# Patient Record
Sex: Male | Born: 1995 | Race: White | Hispanic: No | Marital: Single | Smoking: Current every day smoker
Health system: Southern US, Community
[De-identification: ages and names within clinical notes are randomized; demographics above are authoritative.]

---
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
## 2013-10-15 ENCOUNTER — Emergency Department: Payer: Self-pay | Admitting: Emergency Medicine

## 2015-02-20 NOTE — H&P (Signed)
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  IDENTIFYING INFORMATION: Willie Hawkins is an 19 year old single Caucasian male currently living with his mother in the [place] area. 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 The patient says that he has used acid on a regular basis since the age of 25 and has used twice in the past one week. He also reports using marijuana once a day for the past three years. He says he came to the hospital because he is having "a bad super fucking trip". He says his mother and grandmother brought him because he has been hallucinating at home. The patient says that he is "super frustrated" and that everything is getting on his nerves. He says he cannot tell whether or not a male is a male and a male is a male without looking at the length of their hair. He is also seeing people five steps in front of where they are and seeing people as double. Per his mother, the patient has been somewhat agitated over the past one week and has been throwing objects in the home and punching walls. The patient denies any mood symptoms such as depressive symptoms but does state that he believes he will try and hurt himself if he goes on this way and is asking for help. He had endorsed some homicidal thoughts towards his mother but says that he does not really mean that and he thinks it is the drugs talking. He does admit to some visual hallucinations at night of seeing things on his arm, although he cannot verbalize exactly what it is. The patient  was very vague with his descriptions of hallucinations. He did admit to some paranoid thoughts in believing that the police are after him. He also says that people are talking about things with "sexual references" but he could not describe specifically what they are. He does report problems with insomnia and decreased appetite. He says he's lost about 20 to 30 pounds in the past three months. Although the patient did not admit this to this Clinical research associate, his mother said that he had mentioned something about using mushrooms. The patient denies any history of any prior suicide attempts or inpatient psychiatric hospitalizations. Toxicology screen in the Emergency Room was positive for marijuana but negative for all other substances. Ethanol level was less than 3. Per his mother, he has been abusing substances for 3 to 4 years now but has been resistant to any residential substance abuse treatment. His mother said that they had to "trick him" to get him to the Emergency Room today. While in the Emergency Room, however, he says he voluntarily wants help and is willing to be admitted to the hospital.   PAST PSYCHIATRIC HISTORY: The patient denied any history of any prior suicide attempts but his mother said that he tried to cut himself once in the context of substance use. He denies ever being on any psychotropic medications in the past and is not currently seeing a psychiatrist.   SUBSTANCE ABUSE HISTORY: As stated in the history of present  illness. He only drinks alcohol once a week. There is a history of cannabis dependence for the past three years and hallucinogen abuse. He denies any heroin or opioid abuse. He denies any cocaine use. He does smoke a half a pack of cigarettes to a pack of cigarettes per day since the age of 65.   FAMILY PSYCHIATRIC HISTORY: The patient reports that his father was an alcoholic.   PAST MEDICAL HISTORY: Asthma.   PCP: Dr. Collier Salina    He denies any history of any prior TBI or  seizures. He denies any prior surgical history.  MEDICATIONS: The patient says that he believes he takes an inhaler at times but cannot remember the name. His mother was also unaware of the name of the inhaler that he takes.   ALLERGIES: Bee stings.   SOCIAL HISTORY: The patient says he was born in [place] but raised in the [place] area by his mother. He says his parents divorced when he was young and his father was in prison when he was born. He does have two sisters. He denies any history of any physical or sexual abuse. 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 He also wants to be able to go for music. He denies being in a relationship at this time and has never been married. He has no children. He currently lives with his mom in the [place] area.   LEGAL HISTORY: He does have a history of a DUI for alcohol and marijuana use at the age of 22. He says the charges were dropped and there was no jail time.   MENTAL STATUS EXAM: Willie Hawkins is an 19 year old Caucasian male with shoulder length brown hair. He was fully alert and oriented to time, place, and situation. He knew it was September 2013 but had a difficult time coming up with the day of the month. He did know it was Wednesday. The patient did have a slowed response and delayed response time. He named the prior presidents as Midwife and Danae Orleans but then had difficulty. Speech was regular rate and rhythm, fluent and coherent. Mood was described as being "not good" and affect was anxious. The patient was easily agitated and got frustrated with questions asked. Thought processes were tangential and sometimes disorganized. He denied any suicidal or homicidal thoughts currently but did state that he had felt suicidal and that he may do something to hurt himself if he did not get help. He did report some visual hallucinations currently of seeing people walking five feet in front of himself and some  double vision. He also endorsed some paranoid thoughts that the police were after him and was somewhat delusional. He denied any auditory hallucinations. Attention and concentration were fair to good. Recall was 3 out of 3 initially and 3 out of 3 after five minutes. Judgment and insight were fair by testing but poor by history. He spelled world backwards as "DORWL". He could not do any serial sevens but could do some simple calculations. The patient did not answer questions directly with regards to proverbs.   SUICIDE RISK ASSESSMENT: At this time the patient remains at a moderately elevated risk of harm to self and others secondary to active psychosis. He is saying that he wants to come to the hospital voluntarily. He denies any access to guns.   REVIEW OF SYSTEMS: CONSTITUTIONAL: The patient denies any weakness or fatigue. He does complain of weight loss  in the past three months of 20 to 30 pounds. He denies any fever, chills, or night sweats. HEAD: He denies any headaches or dizziness. EYES: He denies any diplopia or blurred vision. ENT: He denies any hearing loss, neck pain, or throat pain. RESPIRATORY: He denies any shortness breath or cough. CARDIOVASCULAR: He denies any chest pain or orthopnea. GI: He denies any nausea, vomiting, or abdominal pain. He denies any change in bowel movements. GU: He denies incontinence or problems with frequency of urine. ENDOCRINE: He denies any heat or cold intolerance. LYMPHATIC: He denies any anemia or easy bruising. MUSCULOSKELETAL: He denies any muscle or joint pain. NEUROLOGIC: He denies any tingling or weakness. PSYCHIATRIC: Please see history of present illness.   PHYSICAL EXAMINATION:   VITAL SIGNS: Blood pressure 143/90, pulse 95, respirations 20, temperature 98, pulse oximetry 100% on room air.   HEENT: Normocephalic, atraumatic. Pupils equal, round, and reactive to light and accommodation. Extraocular movements intact. Oral mucosa was moist. No lesions  noted.   NECK: Supple. No cervical lymphadenopathy or thyromegaly present.   LUNGS: Clear to auscultation bilaterally. No crackles, rales, or rhonchi.   CARDIAC: S1, S2, present. Regular rate and rhythm. No murmurs, rubs, or gallops.   ABDOMEN: Soft. Normoactive bowel sounds present in all four quadrants. No tenderness noted. No masses noted.   EXTREMITIES: +2 pedal pulses bilaterally. No rashes, cyanosis, clubbing, or edema.   NEUROLOGIC: Cranial nerves II through XII were grossly intact. Gait was normal and steady. Sensation intact. No hypo or hyperreflexia noted.   LABORATORY, DIAGNOSTIC, AND RADIOLOGICAL DATA: Toxicology screen was positive for marijuana but negative for all other substances. Ethanol level was less than 3. Sodium 140, potassium 3.5, chloride 104, CO2 28, BUN 6, creatinine 1.01, glucose 106, alkaline phosphatase 80, AST 25, ALT 27. TSH within normal limits. CBC within normal limits. Urinalysis was nitrite negative, trace leukocyte esterase, 1 WBC, trace bacteria, less than 1 epithelial cell.   DIAGNOSES:  AXIS I:  1. Psychosis, not otherwise specified, rule out substance-induced psychotic disorder, rule out bipolar disorder, most recent episode manic.  2. Cannabis dependence.  3. Hallucinogen abuse.  4. Nicotine dependence.   AXIS II: Deferred.   AXIS III: Asthma.   AXIS IV: Moderate to severe. Comorbid substance use, lack of compliance with substance abuse treatment in the past.   AXIS V: GAF at present equals 25.   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 He also endorsed some suicidal thoughts if he did not get help with treatment and with the hallucinations as he feels like he is on a "bad trip". Will go ahead  and admit to Inpatient Psychiatry for medication management, safety, and stabilization and place on suicide precautions and close observation.   1. Psychosis, not otherwise specified, rule out substance-induced psychotic disorder, rule out bipolar disorder, most recent episode manic. Will plan to start Risperdal 2 mg p.o. at bedtime and trazodone 50 mg p.o. at bedtime p.r.n. Will get EKG to rule out QTc prolongation and lipid panel in the a.m. Will also check B12 and folic acid.  2. Polysubstance abuse including cannabis dependence and hallucinogen abuse. Will use Ativan and Haldol p.r.n. for now and wait for drugs to clear from his system. The patient denies any cocaine, heroin, or opioid abuse. He denies  any alcohol use other than about once a week. Will refer for residential substance abuse treatment.  3. Asthma. Will go ahead and give p.r.n. albuterol inhaler if needed. The patient does not have any respiratory distress.  4. Disposition. The patient has a stable living situation. Will refer for residential substance abuse treatment, however. At this time the patient is wanting to stay in the hospital voluntarily.   TIME SPENT: 80 minutes ____________________________ Doralee AlbinoAarti K. Maryruth BunKapur, MD akk:drc D: 07/28/2012 17:49:45 ET T: 07/28/2012 18:09:22 ET JOB#: [id number]  cc: Aarti K. Maryruth BunKapur, MD, <Dictator> Darliss RidgelAARTI K KAPUR MD ELECTRONICALLY SIGNED 07/29/2012 16:20

## 2017-08-16 ENCOUNTER — Emergency Department: Payer: Self-pay

## 2017-08-16 ENCOUNTER — Emergency Department
Admission: EM | Admit: 2017-08-16 | Discharge: 2017-08-16 | Disposition: A | Discharge status: Home / Self Care | Payer: Self-pay | Attending: Emergency Medicine | Admitting: Emergency Medicine

## 2017-08-16 ENCOUNTER — Encounter: Payer: Self-pay | Admitting: Emergency Medicine

## 2017-08-16 DIAGNOSIS — Y9389 Activity, other specified: Secondary | ICD-10-CM | POA: Insufficient documentation

## 2017-08-16 DIAGNOSIS — S39012A Strain of muscle, fascia and tendon of lower back, initial encounter: Secondary | ICD-10-CM | POA: Insufficient documentation

## 2017-08-16 DIAGNOSIS — Y990 Civilian activity done for income or pay: Secondary | ICD-10-CM | POA: Insufficient documentation

## 2017-08-16 DIAGNOSIS — Y92410 Unspecified street and highway as the place of occurrence of the external cause: Secondary | ICD-10-CM | POA: Insufficient documentation

## 2017-08-16 DIAGNOSIS — M542 Cervicalgia: Secondary | ICD-10-CM | POA: Insufficient documentation

## 2017-08-16 DIAGNOSIS — F17210 Nicotine dependence, cigarettes, uncomplicated: Secondary | ICD-10-CM | POA: Insufficient documentation

## 2017-08-16 MED ORDER — METHOCARBAMOL 500 MG PO TABS: 500.0000 mg | ORAL_TABLET | Freq: Four times a day (QID) | ORAL | 0 refills | Status: DC

## 2017-08-16 MED ORDER — MELOXICAM 15 MG PO TABS: 15.0000 mg | ORAL_TABLET | Freq: Every day | ORAL | 2 refills | Status: AC
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
## 2017-08-16 NOTE — Discharge Instructions (Signed)
Follow-up with the acute care clinic if you are not better in 7-10 days. Use ice on all areas that hurt. Use medications as prescribed. If you're worsening may return to the emergency department or follow-up at the acute care.
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
## 2021-03-19 ENCOUNTER — Emergency Department
Admission: EM | Admit: 2021-03-19 | Discharge: 2021-03-19 | Disposition: A | Discharge status: Home / Self Care | Payer: Medicaid Other | Attending: Emergency Medicine | Admitting: Emergency Medicine

## 2021-03-19 ENCOUNTER — Other Ambulatory Visit: Payer: Self-pay

## 2021-03-19 ENCOUNTER — Encounter: Payer: Self-pay | Admitting: Medical Oncology

## 2021-03-19 DIAGNOSIS — T40601A Poisoning by unspecified narcotics, accidental (unintentional), initial encounter: Secondary | ICD-10-CM

## 2021-03-19 DIAGNOSIS — T402X1A Poisoning by other opioids, accidental (unintentional), initial encounter: Secondary | ICD-10-CM | POA: Insufficient documentation

## 2021-03-19 DIAGNOSIS — F17200 Nicotine dependence, unspecified, uncomplicated: Secondary | ICD-10-CM | POA: Insufficient documentation

## 2021-03-19 LAB — CBC
HCT: 41.2 % (ref 39.0–52.0)
Hemoglobin: 14.3 g/dL (ref 13.0–17.0)
MCH: 30.4 pg (ref 26.0–34.0)
MCHC: 34.7 g/dL (ref 30.0–36.0)
MCV: 87.7 fL (ref 80.0–100.0)
Platelets: 221 10*3/uL (ref 150–400)
RBC: 4.7 MIL/uL (ref 4.22–5.81)
RDW: 12.3 % (ref 11.5–15.5)
WBC: 8.2 10*3/uL (ref 4.0–10.5)
nRBC: 0 % (ref 0.0–0.2)

## 2021-03-19 LAB — COMPREHENSIVE METABOLIC PANEL
ALT: 15 U/L (ref 0–44)
AST: 25 U/L (ref 15–41)
Albumin: 4.8 g/dL (ref 3.5–5.0)
Alkaline Phosphatase: 73 U/L (ref 38–126)
Anion gap: 8 (ref 5–15)
BUN: 23 mg/dL — ABNORMAL HIGH (ref 6–20)
CO2: 26 mmol/L (ref 22–32)
Calcium: 8.9 mg/dL (ref 8.9–10.3)
Chloride: 103 mmol/L (ref 98–111)
Creatinine, Ser: 1.47 mg/dL — ABNORMAL HIGH (ref 0.61–1.24)
GFR, Estimated: >60 mL/min (ref >60)
Glucose, Bld: 206 mg/dL — ABNORMAL HIGH (ref 70–99)
Potassium: 3.6 mmol/L (ref 3.5–5.1)
Sodium: 137 mmol/L (ref 135–145)
Total Bilirubin: 1.3 mg/dL — ABNORMAL HIGH (ref 0.3–1.2)
Total Protein: 7.9 g/dL (ref 6.5–8.1)

## 2021-03-19 MED ORDER — SODIUM CHLORIDE 0.9 % IV BOLUS
1000.0000 mL | Freq: Once | INTRAVENOUS | Status: AC
  Administered 2021-03-19: 1000 mL via INTRAVENOUS

## 2021-03-19 NOTE — ED Notes (Signed)
NAD noted at this time. Pt resting in bed with eyes closed. Pt refused visitor (this RN received phone call from screener for mom and SO, patient refused).
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
## 2021-03-19 NOTE — ED Provider Notes (Signed)
Procedures     ----------------------------------------- 4:03 PM on 03/19/2021 -----------------------------------------  Patient is awake, alert.  Clear speech, steady gait.  His girlfriend has arrived to take him home.  He is clinically sober, normal vital signs, no acute symptoms at this point.    Sharman Cheek, MD 03/19/21 [phone]
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
## 2021-03-19 NOTE — ED Notes (Signed)
Pt's SO at bedside per his request. Pt noted to be more alert and able to stay awake. Pt asking what the delay is. This RN explained delay to patient and explained why patient was here, explained that up until now patient had been sleeping and due to risk of Narcan wearing off patient was being monitored while in the hall. Pt states understanding, SO states understanding at this time and remains with patient at bedside. EDP made aware that patient now more alert and has sober ride home.

## 2021-03-19 NOTE — ED Notes (Signed)
NAD noted at time of D/C. Pt denies questions or concerns. Pt ambulatory to the lobby at this time. Pt's SO with patient at time of D/C. Pt refused wheelchair to the lobby. Return precautions reviewed with patient at this time. Verbal consent for D/C obtained at this time.

## 2022-02-08 ENCOUNTER — Other Ambulatory Visit: Payer: Self-pay

## 2022-02-08 ENCOUNTER — Encounter: Payer: Self-pay | Admitting: Emergency Medicine

## 2022-02-08 ENCOUNTER — Emergency Department
Admission: EM | Admit: 2022-02-08 | Discharge: 2022-02-09 | Disposition: A | Discharge status: Home / Self Care | Payer: Self-pay | Attending: Emergency Medicine | Admitting: Emergency Medicine

## 2022-02-08 ENCOUNTER — Emergency Department: Payer: Self-pay

## 2022-02-08 DIAGNOSIS — T40601A Poisoning by unspecified narcotics, accidental (unintentional), initial encounter: Secondary | ICD-10-CM

## 2022-02-08 DIAGNOSIS — T402X1A Poisoning by other opioids, accidental (unintentional), initial encounter: Secondary | ICD-10-CM | POA: Insufficient documentation

## 2022-02-08 DIAGNOSIS — X58XXXA Exposure to other specified factors, initial encounter: Secondary | ICD-10-CM | POA: Insufficient documentation

## 2022-02-08 MED ORDER — NALOXONE HCL 4 MG/0.1ML NA LIQD: NASAL | 1 refills | Status: DC

## 2022-02-08 NOTE — ED Triage Notes (Signed)
Pt was found in strret unresponsive soaking wet. Last known time pt can remember was 1 hour prior. Pt states he snorted fentanyl but unknown amount. Pt has uncontrolled shivering at this time. Fire department administered 4mg  narcan intranasally. Pt is alert and oriented at time of triage.  ?

## 2022-02-08 NOTE — ED Provider Notes (Signed)
? ?[facility] ?Provider Note ? ? ? Event Date/Time  ? First MD Initiated Contact with Patient 02/08/22 1905   ?  (approximate) ? ? ?History  ? ?Drug Overdose ? ? ?HPI ? ?Willie Hawkins is a 26 y.o. male  who, per psychiatry note dated 07/28/12 had history of polysubstance abuse, who presents to the emergency department today because of concern for drug overdose.  Patient states that he is trying to stop using opioids.  He states he is in an intensive outpatient program.  Says that he slept up today.  He thought he was taking heroin but is concerned that there was fentanyl in it.  He was given 4 mg of Narcan by first responders.  Patient denies any shortness of breath at the time my exam. ? ?Physical Exam  ? ?Triage Vital Signs: ?ED Triage Vitals  ?Enc Vitals Group  ?   BP 02/08/22 1845 (!) 149/95  ?   Pulse Rate 02/08/22 1856 93  ?   Resp 02/08/22 1845 (!) 30  ?   Temp 02/08/22 1856 (!) 97 ?F (36.1 ?C)  ?   Temp Source 02/08/22 1856 Rectal  ?   SpO2 --   ?   Weight 02/08/22 1843 170 lb (77.1 kg)  ?   Height 02/08/22 1843 5\' 6"  (1.676 m)  ?   Head Circumference --   ?   Peak Flow --   ?   Pain Score 02/08/22 1843 0  ? ?Most recent vital signs: ?Vitals:  ? 02/08/22 1856 02/08/22 1856  ?BP:    ?Pulse: 93   ?Resp:    ?Temp:  (!) 97 ?F (36.1 ?C)  ? ? ?General: Awake, no distress.  ?CV:  Good peripheral perfusion. Regular rate and rhythm. ?Resp:  Normal effort. Lungs clear to auscultation ?Abd:  No distention. Non tender.  ? ? ? ?ED Results / Procedures / Treatments  ? ?Labs ?(all labs ordered are listed, but only abnormal results are displayed) ?Labs Reviewed  ?CBG MONITORING, ED  ? ? ? ?EKG ? ?I06/08/23, attending physician, personally viewed and interpreted this EKG ? ?EKG Time: 1844 ?Rate: 112 ?Rhythm: sinus tachycardia ?Axis: normal ?Intervals: qtc 451 ?QRS: narrow ?ST changes: no st elevation ?Impression: abnormal ekg ? ?RADIOLOGY ?I independently interpreted and visualized the CXR. My  interpretation: No pneumonia. No pneumothorax. ?Radiology interpretation:  ?IMPRESSION:  ?Low lung volumes with mild bilateral infrahilar atelectasis and/or  ?infiltrate.  ? ? ? ? ?PROCEDURES: ? ?Critical Care performed: No ? ?Procedures ? ? ?MEDICATIONS ORDERED IN ED: ?Medications - No data to display ? ? ?IMPRESSION / MDM / ASSESSMENT AND PLAN / ED COURSE  ?I reviewed the triage vital signs and the nursing notes. ?             ?               ? ?Differential diagnosis includes, but is not limited to, drug overdose. ? ?Patient presented to the emergency department today because of concerns for drug overdose.  Patient did receive Narcan in the field.  On exam here patient does have some slight hypoxia.  He was put on nasal cannula.  Chest x-ray was obtained which did not show any obvious pneumonia or edema.  Do think it is likely secondary to drug use.  Will plan on watching here in the emergency department. ? ?FINAL CLINICAL IMPRESSION(S) / ED DIAGNOSES  ? ?Final diagnoses:  ?Opiate overdose, accidental or unintentional, initial  encounter [facility])  ? ? ? ?Note:  This document was prepared using Dragon voice recognition software and may include unintentional dictation errors. ? ?  ?Phineas Semen, MD ?02/08/22 2316 ? ?

## 2022-02-08 NOTE — ED Provider Notes (Signed)
----------------------------------------- ?  11:04 PM on 02/08/2022 ?----------------------------------------- ? ?Blood pressure 112/81, pulse 84, temperature 98 ?F (36.7 ?C), temperature source Oral, resp. rate 20, height 5\' 6"  (1.676 m), weight 77.1 kg, SpO2 (!) 3 %. ? ?Assuming care from Dr. .  In short, Willie Hawkins is a 26 y.o. male with a chief complaint of Drug Overdose ?34  Refer to the original H&P for additional details. ? ?The current plan of care is to reassess respiratory status and observe until patient clinically sober. ? ?----------------------------------------- ?12:24 AM on 02/09/2022 ?----------------------------------------- ?Patient is now awake and alert, appears clinically sober and is maintaining O2 sats on room air.  He is appropriate for discharge home and was counseled on opiate avoidance in the future.  He was counseled to return to the ED for new or worsening symptoms, patient and mother agree with plan. ? ?  ?04/11/2022, MD ?02/09/22 0024 ? ?

## 2022-02-08 NOTE — Discharge Instructions (Addendum)
Please seek medical attention for any high fevers, chest pain, shortness of breath, change in behavior, persistent vomiting, bloody stool or any other new or concerning symptoms.  

## 2022-02-08 NOTE — ED Notes (Signed)
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 He thought what he was getting today was heroin but it was not.  ?

## 2022-04-20 ENCOUNTER — Emergency Department
Admission: EM | Admit: 2022-04-20 | Discharge: 2022-04-20 | Disposition: A | Discharge status: Home / Self Care | Payer: Medicaid Other | Attending: Emergency Medicine | Admitting: Emergency Medicine

## 2022-04-20 DIAGNOSIS — T40411A Poisoning by fentanyl or fentanyl analogs, accidental (unintentional), initial encounter: Secondary | ICD-10-CM

## 2022-04-20 MED ORDER — NALOXONE HCL 4 MG/0.1ML NA LIQD: 0.4000 mg | Freq: Once | NASAL | 0 refills | Status: AC

## 2022-04-20 NOTE — ED Triage Notes (Signed)
Pt snorted fentanyl tonight and was found unresponsive. Pt was given a total of 8mg  intranasal narcan and is now alert and oriented on Room air. Vital signs WNL

## 2022-04-20 NOTE — ED Provider Notes (Signed)
[facility] Provider Note    Event Date/Time   First MD Initiated Contact with Patient 04/20/22 0041     (approximate)   History   Drug Overdose   HPI  Willie Hawkins is a 26 y.o. male with a history of polysubstance abuse who presents via EMS for accidental overdose.  Patient reports using Xanax and snorting fentanyl today.  Patient was found unresponsive and received a total of 8 mg of intranasal Narcan prior to arrival.  Patient arrives alert and oriented x3 with a GCS of 15.  Patient is very regretful and denies any suicidal thoughts.  Patient denies any chest pain or shortness of breath.  No CPR was done.     No past medical history on file.  No past surgical history on file.   Physical Exam   Triage Vital Signs: ED Triage Vitals  Enc Vitals Group     BP 04/20/22 0042 (!) 172/107     Pulse Rate 04/20/22 0042 (!) 105     Resp 04/20/22 0042 15     Temp 04/20/22 0050 (!) 97.4 F (36.3 C)     Temp Source 04/20/22 0050 Oral     SpO2 04/20/22 0042 100 %     Weight --      Height --      Head Circumference --      Peak Flow --      Pain Score 04/20/22 0043 0     Pain Loc --      Pain Edu? --      Excl. in GC? --     Most recent vital signs: Vitals:   04/20/22 0230 04/20/22 0330  BP: 119/79 113/75  Pulse: 70 67  Resp: 16   Temp:    SpO2: 99% 97%    General: No apparent distress HEENT: moist mucous membranes CV: RRR Pulm: Normal WOB GI: soft and non tender MSK: no edema or cyanosis Neuro: face symmetric, moving all extremities Psych: Calm and cooperative, normal speech and behavior, mood and affect normal   ED Results / Procedures / Treatments   Labs (all labs ordered are listed, but only abnormal results are displayed) Labs Reviewed - No data to display   EKG  none   RADIOLOGY none   PROCEDURES:  Critical Care performed: No  Procedures    IMPRESSION / MDM / ASSESSMENT AND PLAN / ED COURSE  I reviewed the  triage vital signs and the nursing notes.  26 y.o. male with a history of polysubstance abuse who presents via EMS for accidental overdose of fentanyl.  Patient is status post a milligrams of intranasal Narcan.  On arrival patient is alert and oriented x3 with a GCS of 15, hemodynamically stable.  Patient denies any suicidal intent and reports this was an accidental overdose.  Patient is pleasant and cooperative.  Will monitor for a total of 3 hours for any signs of recurrent respiratory depression or decreased mentation.  If patient remains stable plan to discharge home around 3:30 AM.  Patient placed on telemetry with end-tidal CO2 for close monitoring of vital signs.   MEDICATIONS GIVEN IN ED: Medications - No data to display   ED COURSE: Patient was monitored for 3 hours with no recurrence of respiratory depression or changes in his mentation.  Patient is now stable for discharge home.  Counseling provided.  Discussed my standard return precautions.   Consults: None   EMR reviewed none available  FINAL CLINICAL IMPRESSION(S) / ED DIAGNOSES   Final diagnoses:  Accidental fentanyl overdose, initial encounter (HCC)     Rx / DC Orders   ED Discharge Orders          Ordered    naloxone (NARCAN) nasal spray 4 mg/0.1 mL   Once        04/20/22 0254             Note:  This document was prepared using Dragon voice recognition software and may include unintentional dictation errors.   Please note:  Patient was evaluated in Emergency Department today for the symptoms described in the history of present illness. Patient was evaluated in the context of the global COVID-19 pandemic, which necessitated consideration that the patient might be at risk for infection with the SARS-CoV-2 virus that causes COVID-19. Institutional protocols and algorithms that pertain to the evaluation of patients at risk for COVID-19 are in a state of rapid change based on information released by  regulatory bodies including the CDC and federal and state organizations. These policies and algorithms were followed during the patient's care in the ED.  Some ED evaluations and interventions may be delayed as a result of limited staffing during the pandemic.       Don Perking, [place], MD 04/20/22 [id number]

## 2024-02-12 ENCOUNTER — Encounter: Payer: Self-pay | Admitting: Nurse Practitioner

## 2024-02-12 ENCOUNTER — Ambulatory Visit: Payer: MEDICAID | Admitting: Nurse Practitioner

## 2024-02-12 DIAGNOSIS — Z113 Encounter for screening for infections with a predominantly sexual mode of transmission: Secondary | ICD-10-CM

## 2024-02-12 DIAGNOSIS — N341 Nonspecific urethritis: Secondary | ICD-10-CM

## 2024-02-12 LAB — GRAM STAIN

## 2024-02-12 LAB — HM HEPATITIS C SCREENING LAB: HM Hepatitis Screen: NEGATIVE

## 2024-02-12 LAB — HM HIV SCREENING LAB: HM HIV Screening: NEGATIVE

## 2024-02-12 LAB — HEPATITIS B SURFACE ANTIGEN

## 2024-02-12 MED ORDER — DOXYCYCLINE HYCLATE 100 MG PO TABS: 100.0000 mg | ORAL_TABLET | Freq: Two times a day (BID) | ORAL | Status: AC

## 2024-02-12 NOTE — Progress Notes (Signed)
 [facility] Department STI clinic [address], [address] [place] [address] Main phone: [phone]  STI screening visit  Subjective:  Willie Hawkins is a 28 y.o. male being seen today for an STI screening visit. The patient reports they do have symptoms.    Patient has the following medical conditions:  There are no active problems to display for this patient.   Chief Complaint  Patient presents with   SEXUALLY TRANSMITTED DISEASE   Patient is a pleasant 28 y.o. male who presents to the office today requesting symptomatic STI testing. He reports 2 male partners in the last 2 months, and practices penile/vaginal penetrative sex and oral sex. Patient reports using condoms sometimes. Patient indicates a history of gonorrhea. He reports last sex was 1 day ago.   STI screening history: Last HIV test per patient/review of record was No results found for: "HMHIVSCREEN" No results found for: "HIV"  Last HEPC test per patient/review of record was No results found for: "HMHEPCSCREEN" No components found for: "HEPC"   Last HEPB test per patient/review of record was No components found for: "HMHEPBSCREEN"   Fertility: Does the patient or their partner desires a pregnancy in the next year? No  Screening for MPX risk: Does the patient have an unexplained rash? No Is the patient MSM? No Does the patient endorse multiple sex partners or anonymous sex partners? Yes Did the patient have close or sexual contact with a person diagnosed with MPX? No Has the patient traveled outside the [place] where MPX is endemic? No Is there a high clinical suspicion for MPX-- evidenced by one of the following No  -Unlikely to be chickenpox  -Lymphadenopathy  -Rash that present in same phase of evolution on any given body part   See flowsheet for further details and programmatic requirements.   Immunization History  Administered Date(s) Administered   Dtap, Unspecified  07/08/1994, 09/04/1994, 11/19/1994, 07/22/1995, 11/14/1998   HIB, Unspecified 07/08/1994, 09/04/1994, 11/19/1994   Hep B, Unspecified 07/08/1994, 11/19/1994   Hepatitis A, Ped/Adol-2 Dose 01/31/2010   Influenza, Seasonal, Injecte, Preservative Fre 08/23/2009, 08/12/2010, 09/03/2011   Influenza,inj,Quad PF,6+ Mos 07/28/2012   MMR 05/21/1995, 11/14/1998   Meningococcal Acwy, Unspecified 08/08/2006   PFIZER(Purple Top)SARS-COV-2 Vaccination 10/24/2020   Polio, Unspecified 07/08/1994, 09/04/1994, 11/19/1994, 05/21/1995, 11/14/1998   Td (Adult),unspecified 06/01/2004   Tdap 01/31/2010   Varicella 11/14/1998, 01/31/2010     The following portions of the patient's history were reviewed and updated as appropriate: allergies, current medications, past medical history, past social history, past surgical history and problem list.  Objective:  There were no vitals filed for this visit.  Physical Exam Nursing note reviewed. Exam conducted with a chaperone present Alfonse Alpers, CMA present as chaperone).  Constitutional:      Appearance: Normal appearance.  HENT:     Head: Normocephalic.     Salivary Glands: Right salivary gland is not diffusely enlarged or tender. Left salivary gland is not diffusely enlarged or tender.     Mouth/Throat:     Lips: Pink. No lesions.     Mouth: Mucous membranes are moist. No oral lesions.     Tongue: No lesions. Tongue does not deviate from midline.     Pharynx: Oropharynx is clear. Uvula midline. No oropharyngeal exudate or posterior oropharyngeal erythema.     Tonsils: No tonsillar exudate.  Eyes:     General:        Right eye: No discharge.  Left eye: No discharge.     Conjunctiva/sclera:     Right eye: Right conjunctiva is not injected. No exudate.    Left eye: Left conjunctiva is not injected. No exudate. Pulmonary:     Effort: Pulmonary effort is normal.  Genitourinary:    Pubic Area: No rash or pubic lice.      Penis: Normal. No tenderness,  discharge, swelling or lesions.      Testes: Normal.     Epididymis:     Right: Normal. No mass or tenderness.     Left: Normal. No mass or tenderness.     Tanner stage (genital): 5.     Comments: Patient reports discharge, but none seen on exam today. Lymphadenopathy:     Head:     Right side of head: No submental, submandibular, tonsillar, preauricular or posterior auricular adenopathy.     Left side of head: No submental, submandibular, tonsillar, preauricular or posterior auricular adenopathy.     Cervical: No cervical adenopathy.     Right cervical: No superficial or posterior cervical adenopathy.    Left cervical: No superficial or posterior cervical adenopathy.     Upper Body:     Right upper body: No supraclavicular or axillary adenopathy.     Left upper body: No supraclavicular or axillary adenopathy.     Lower Body: No right inguinal adenopathy. No left inguinal adenopathy.  Skin:    General: Skin is warm and dry.     Findings: No lesion or rash.     Comments: Skin tone appropriate for ethnicity.   Neurological:     Mental Status: He is alert and oriented to person, place, and time.  Psychiatric:        Attention and Perception: Attention and perception normal.        Mood and Affect: Mood and affect normal.        Speech: Speech normal.        Behavior: Behavior normal. Behavior is cooperative.        Thought Content: Thought content normal.     Assessment and Plan:  Willie Hawkins is a 28 y.o. male presenting to the [facility] Department for STI screening  1. Screening for venereal disease  - Gonococcus culture - Gram stain - HBV Antigen/Antibody [facility] - HIV/HCV [facility] - Chlamydia/GC NAA, Confirmation - Syphilis Serology, [facility]  2. NGU (nongonococcal urethritis) (Primary) Penile gram stain positive in clinic today for >2WBC/hpf. Treating presumptively as chlamydia/NGU per CDC guidelines with Docycycline.  I provided counseling  today regarding the medication. We discussed the medication, the side effects and when to call clinic. Patient given the opportunity to ask questions. Questions answered.   - doxycycline (VIBRA-TABS) 100 MG tablet; Take 1 tablet (100 mg total) by mouth 2 (two) times daily for 7 days. Lot #[id number], Manufacturer: Lupin, Expiration: 03/02/2025.  Patient does have STI symptoms Patient accepted all screenings including  urine GC/Chlamydia, and blood work for HIV/Syphilis. Patient meets criteria for HepB screening? Yes. Ordered? yes Patient meets criteria for HepC screening? Yes. Ordered? yes Recommended condom use with all sex Discussed importance of condom use for STI prevention  Treat positive test results per standing order. Discussed time line for State Lab results and that patient will be called with positive results and encouraged patient to call if he had not heard in 2 weeks Recommended repeat testing in 3 months with positive results. Recommended returning for continued or worsening symptoms.  No follow-ups on file.  No future appointments.  Total time with patient 30 minutes.   Edmonia James, NP

## 2024-02-12 NOTE — Progress Notes (Signed)
 Pt is here for STD screening. The patient was dispensed Doxycycline 100 mg 2x/day for 7 days today. I provided counseling today regarding the medication. We discussed the medication, the side effects and when to call clinic. Patient given the opportunity to ask questions for any clarification. Condoms declined, brochure and contact card given. Sonda Primes, RN

## 2024-02-16 LAB — CHLAMYDIA/GC NAA, CONFIRMATION
Chlamydia trachomatis, NAA: NEGATIVE
Neisseria gonorrhoeae, NAA: NEGATIVE

## 2024-02-18 LAB — GONOCOCCUS CULTURE

## 2024-03-20 IMAGING — DX DG CHEST 1V PORT
1 series · 1 of 1 positions shown · non-contrast
Comparison: July 02, 2012

CLINICAL DATA: Found unresponsive.

EXAM:
PORTABLE CHEST 1 VIEW

[chest ap]
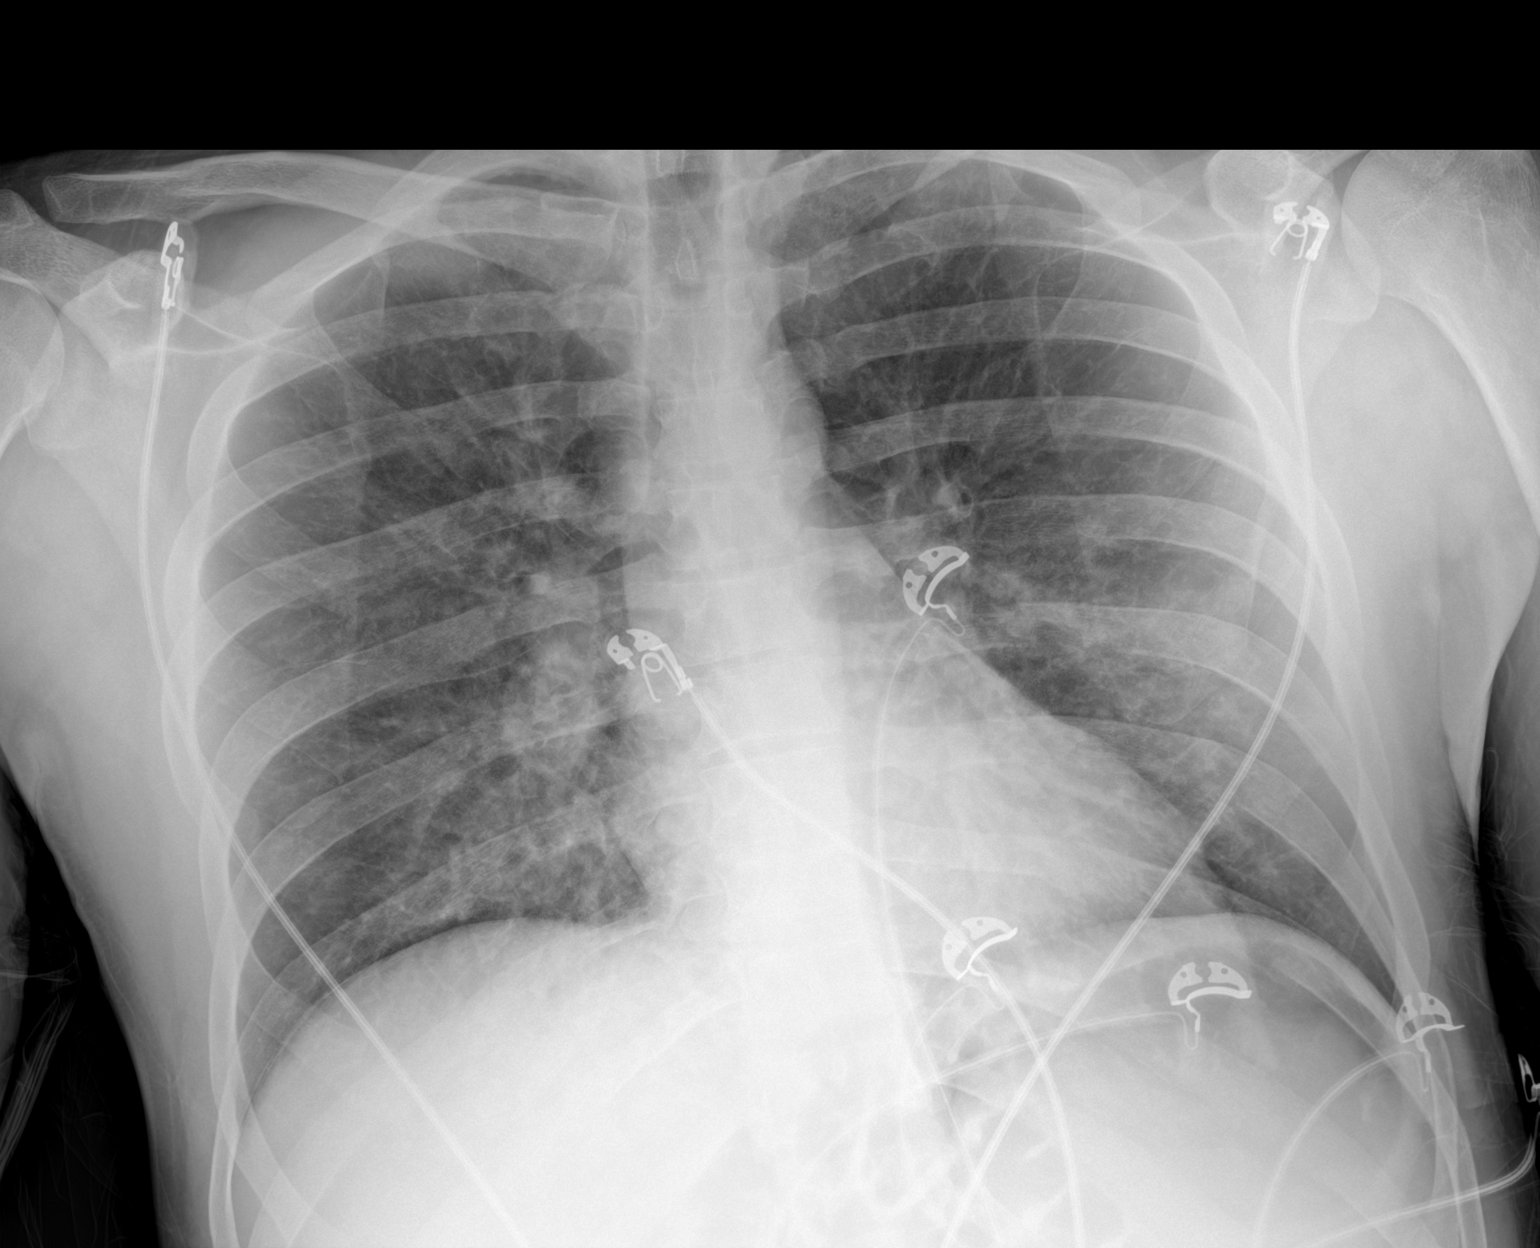

[1 of 1 positions shown; findings below may reference images not displayed]

FINDINGS: Low lung volumes are seen with mild bilateral infrahilar areas of
atelectasis and/or infiltrate. There is no evidence of a pleural
effusion or pneumothorax. The heart size and mediastinal contours
are within normal limits. The visualized skeletal structures are
unremarkable.
IMPRESSION: Low lung volumes with mild bilateral infrahilar atelectasis and/or
infiltrate.
